# Patient Record
Sex: Female | Born: 1965 | Race: Black or African American | Hispanic: No | Marital: Single | Smoking: Never smoker
Health system: Southern US, Community
[De-identification: ages and names within clinical notes are randomized; demographics above are authoritative.]

---

## 2012-09-14 ENCOUNTER — Other Ambulatory Visit (HOSPITAL_COMMUNITY)
Admission: RE | Admit: 2012-09-14 | Discharge: 2012-09-14 | Disposition: A | Discharge status: Home / Self Care | Payer: BC Managed Care – PPO | Source: Ambulatory Visit | Attending: Obstetrics and Gynecology | Admitting: Obstetrics and Gynecology

## 2012-09-14 DIAGNOSIS — Z124 Encounter for screening for malignant neoplasm of cervix: Secondary | ICD-10-CM | POA: Insufficient documentation

## 2015-07-13 ENCOUNTER — Emergency Department (HOSPITAL_BASED_OUTPATIENT_CLINIC_OR_DEPARTMENT_OTHER)
Admission: EM | Admit: 2015-07-13 | Discharge: 2015-07-13 | Disposition: A | Discharge status: Home / Self Care | Payer: BLUE CROSS/BLUE SHIELD | Attending: Emergency Medicine | Admitting: Emergency Medicine

## 2015-07-13 ENCOUNTER — Encounter (HOSPITAL_BASED_OUTPATIENT_CLINIC_OR_DEPARTMENT_OTHER): Payer: Self-pay

## 2015-07-13 ENCOUNTER — Emergency Department (HOSPITAL_BASED_OUTPATIENT_CLINIC_OR_DEPARTMENT_OTHER): Payer: BLUE CROSS/BLUE SHIELD

## 2015-07-13 DIAGNOSIS — J0100 Acute maxillary sinusitis, unspecified: Secondary | ICD-10-CM | POA: Insufficient documentation

## 2015-07-13 DIAGNOSIS — R61 Generalized hyperhidrosis: Secondary | ICD-10-CM | POA: Diagnosis present

## 2015-07-13 LAB — CBG MONITORING, ED: Glucose-Capillary: 129 mg/dL — ABNORMAL HIGH (ref 65–99)

## 2015-07-13 MED ORDER — GUAIFENESIN-DM 100-10 MG/5ML PO SYRP: 5.0000 mL | ORAL_SOLUTION | ORAL | Status: AC | PRN

## 2015-07-13 MED ORDER — DEXAMETHASONE SODIUM PHOSPHATE 10 MG/ML IJ SOLN
10.0000 mg | Freq: Once | INTRAMUSCULAR | Status: AC
Start: 2015-07-13 — End: 2015-07-13
  Administered 2015-07-13: 10 mg via INTRAVENOUS
  Filled 2015-07-13: qty 1

## 2015-07-13 NOTE — ED Provider Notes (Signed)
CSN: [id number]     Arrival date & time 07/13/15  1038 History   First MD Initiated Contact with Patient 07/13/15 1044     Chief Complaint  Patient presents with  . diaphoresis     HPI  49 yo healthy F presents with acute onset sinus pressure, subjective fever, and dry cough this morning after she's had a cold for the last week. She denies chest pain, shortness of breath, or cardiac risk factors.  History reviewed. No pertinent past medical history. History reviewed. No pertinent past surgical history.   No family history on file. Social History  Substance Use Topics  . Smoking status: Never Smoker   . Smokeless tobacco: None  . Alcohol Use: None   OB History    No data available     Review of Systems  Constitutional: Positive for fever, chills and diaphoresis.  HENT: Positive for congestion, postnasal drip and sinus pressure. Negative for sore throat and tinnitus.   Eyes: Negative for redness.  Respiratory: Negative for chest tightness, shortness of breath and wheezing.   Cardiovascular: Negative for chest pain.  Gastrointestinal: Negative for abdominal pain.  Musculoskeletal: Negative for myalgias.  Neurological: Negative for dizziness and light-headedness.    Allergies  Review of patient's allergies indicates no known allergies.  Home Medications   Prior to Admission medications   Not on File   BP 133/73 mmHg  Pulse 74  Temp(Src) 98.2 F (36.8 C) (Oral)  Resp 18  Ht  (1.6 m)  Wt 168 lb (76.204 kg)  BMI 29.77 kg/m2  SpO2 97%  LMP 07/12/2015 Physical Exam  Constitutional: She appears well-developed and well-nourished.  HENT:  Head: Normocephalic and atraumatic.  Maxillary sinuses tender to palpation bilaterally  Eyes: Conjunctivae and EOM are normal.  Neck: Normal range of motion. Neck supple.  Cardiovascular: Normal rate and regular rhythm.   Pulmonary/Chest: Effort normal and breath sounds normal.  Abdominal: Soft. Bowel sounds are normal.  Skin:  Skin is warm and dry.  Psychiatric: She has a normal mood and affect. Her behavior is normal.    ED Course  Procedures (including critical care time)  Labs Review Labs Reviewed  CBG MONITORING, ED - Abnormal; Notable for the following:    Glucose-Capillary 129 (*)    All other components within normal limits    Imaging Review No results found. I have personally reviewed and evaluated these images and lab results as part of my medical decision-making.   EKG Interpretation   Date/Time:  Sunday July 13 2015 10:59:22 EDT Ventricular Rate:  75 PR Interval:  166 QRS Duration: 82 QT Interval:  340 QTC Calculation: 379 R Axis:   55 Text Interpretation:  Normal sinus rhythm with sinus arrhythmia  Nonspecific T wave abnormality Abnormal ECG No old tracing to compare  Confirmed by BELFI  MD, MELANIE ([id number]) on 07/13/2015 11:02:13 AM      MDM   Final diagnoses:  Acute maxillary sinusitis, recurrence not specified   Healthy 49 yo F with acutely worsened sinus pressure, subjective fever, cough, and tenderness to maxillary sinuses concerning for viral sinusitis. She got a shot of decadron in the ED. No antibiotics given her history of sinus pressure for only 1 day; I think this is viral without bacterial superinfection yet. Chest x-ray was negative for consolidations. Recommended follow-up with her PCP in 1 week to ensure resolution of her symptoms. Rxed dextromethorphan and guafienesin.    Selina Cooley, MD 07/13/15 1134  Rolan Bucco, MD 07/13/15  1201 

## 2015-07-13 NOTE — Discharge Instructions (Signed)

## 2015-07-13 NOTE — ED Notes (Addendum)
Patient reports that after awakening developed diaphoresis and dizziness at 10am. Had not eaten breakfast when symptoms started. Complains of nausea on arrival. Skin warm and dry. Complains of ongoing dizziness as well

## 2015-07-13 NOTE — ED Notes (Signed)
MD at bedside. 

## 2016-04-10 IMAGING — CR DG CHEST 2V
2 series · 2 of 2 positions shown · non-contrast
Comparison: None.

CLINICAL DATA: After awakening developed diaphoresis and dizziness
at 10 o'clock a.m.. Denies chest pain or shortness breath.

EXAM:
CHEST  2 VIEW

[w chest pa]
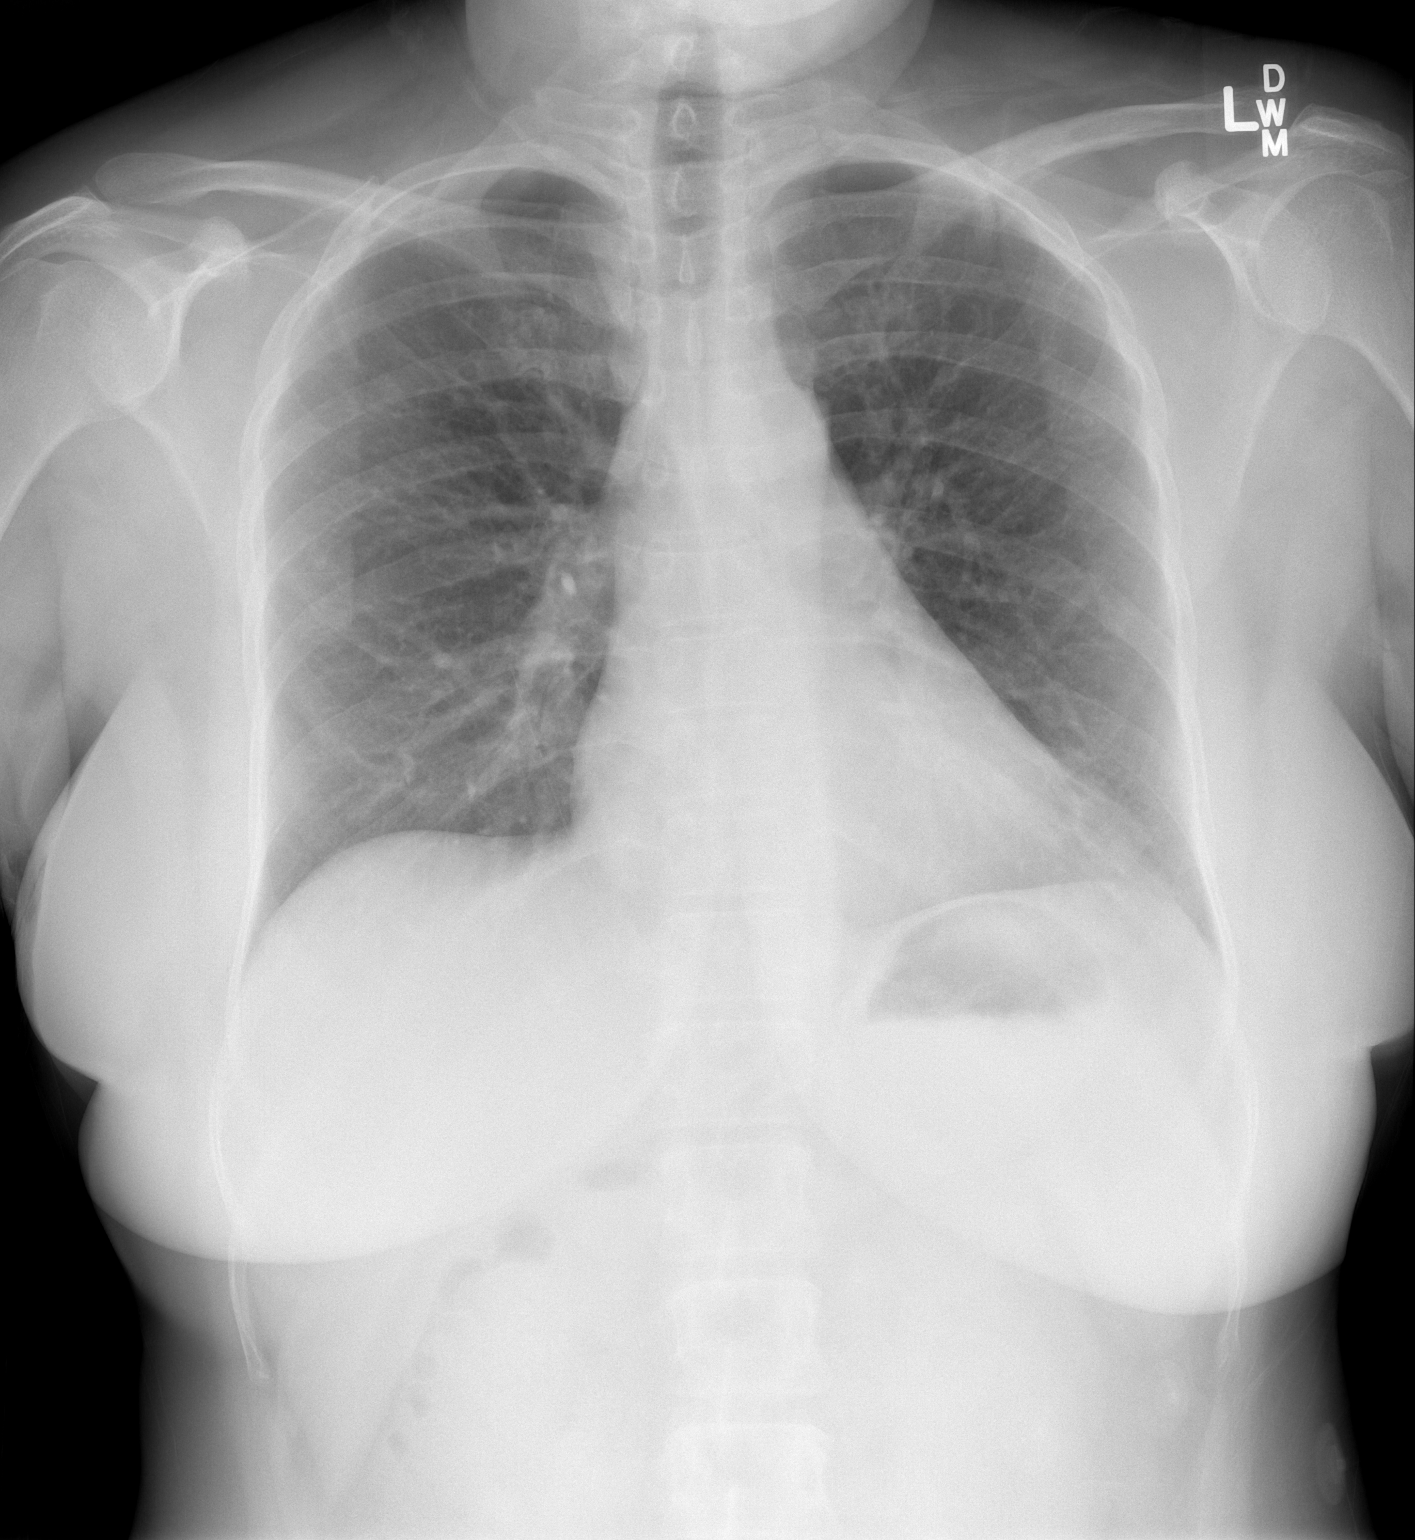

[w chest lat]
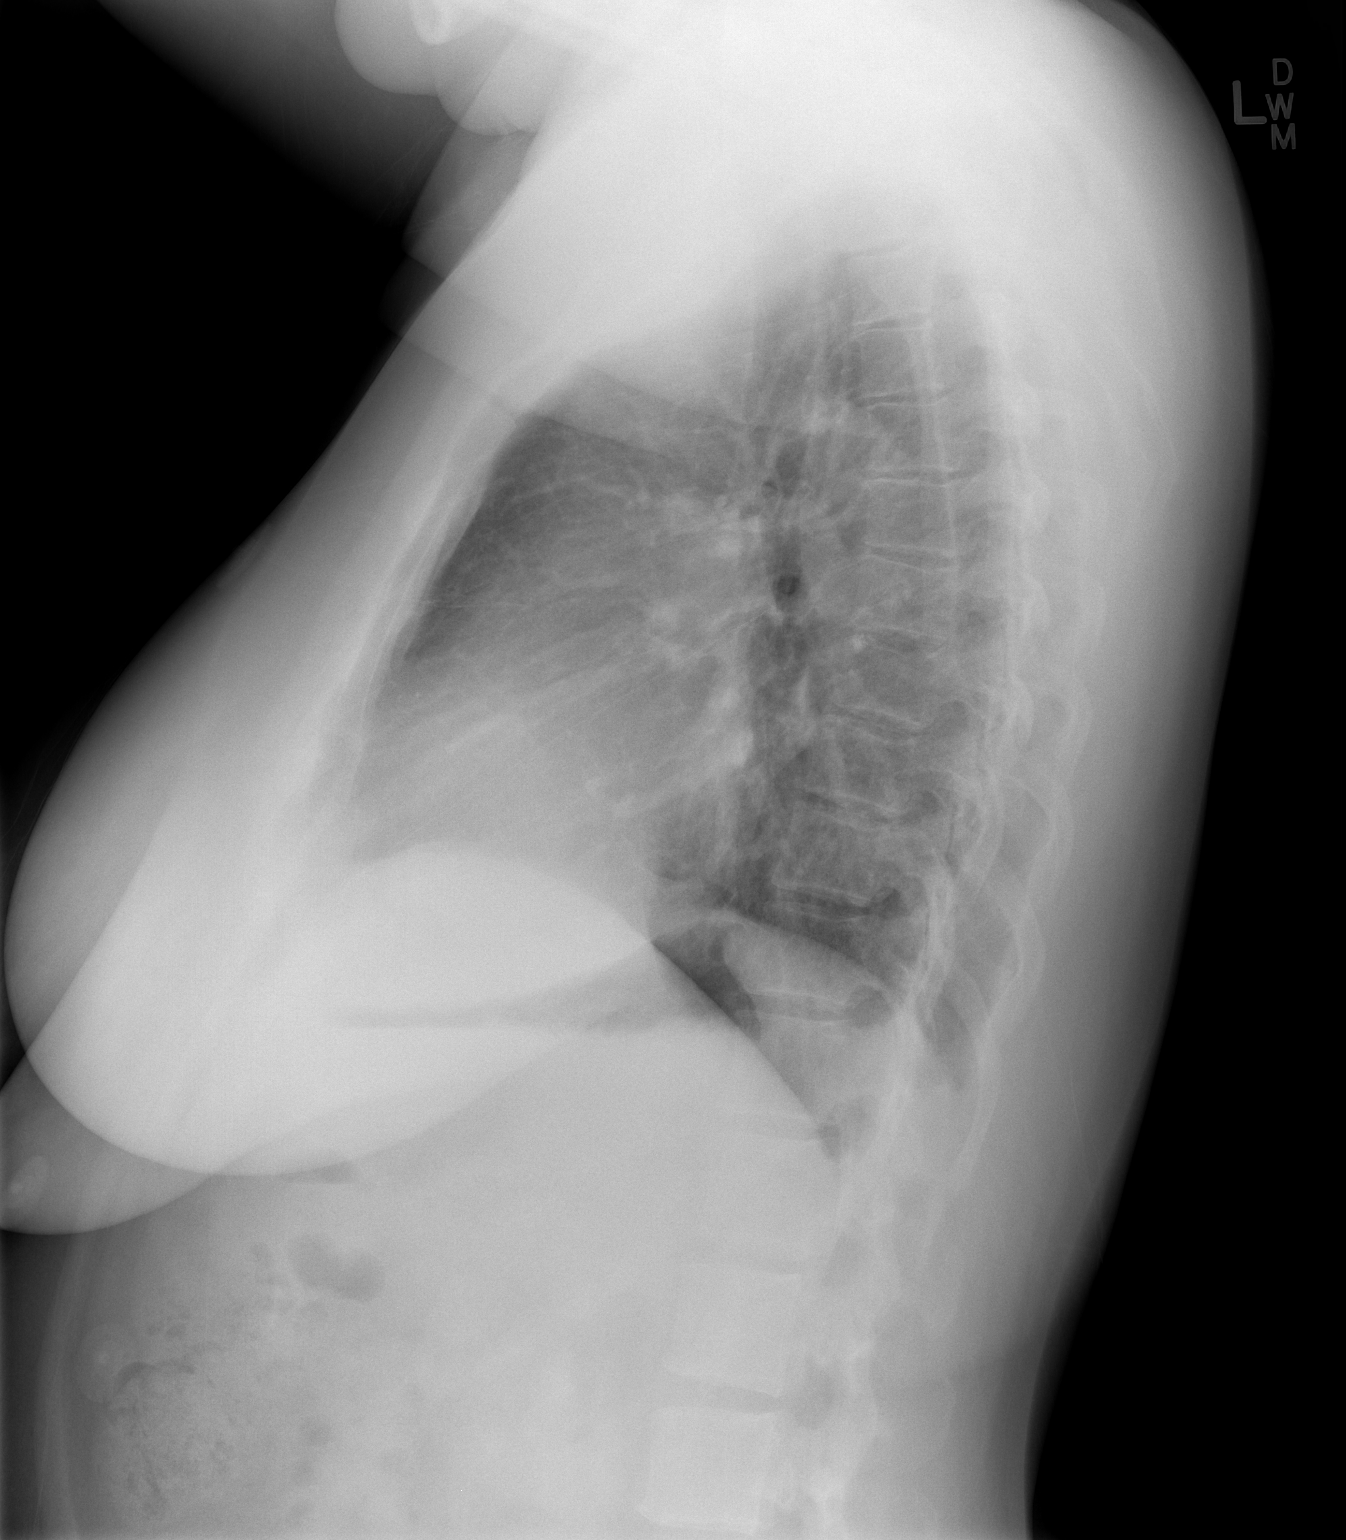

[2 of 2 positions shown; findings below may reference images not displayed]

FINDINGS: The heart size and mediastinal contours are within normal limits.
Both lungs are clear. The visualized skeletal structures are
unremarkable.
IMPRESSION: No active cardiopulmonary disease.
